# Patient Record
Sex: Male | Born: 2000 | Race: White | Hispanic: Yes | Marital: Single | Smoking: Never smoker
Health system: Southern US, Community
[De-identification: ages and names within clinical notes are randomized; demographics above are authoritative.]

---
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
## 2019-01-06 DIAGNOSIS — R05 Cough: Secondary | ICD-10-CM | POA: Diagnosis not present

## 2019-01-06 DIAGNOSIS — U071 COVID-19: Secondary | ICD-10-CM | POA: Diagnosis not present

## 2019-01-06 DIAGNOSIS — M791 Myalgia, unspecified site: Secondary | ICD-10-CM | POA: Diagnosis not present

## 2019-09-23 ENCOUNTER — Other Ambulatory Visit: Payer: Self-pay

## 2019-09-23 ENCOUNTER — Ambulatory Visit (INDEPENDENT_AMBULATORY_CARE_PROVIDER_SITE_OTHER): Payer: Medicaid Other | Admitting: Pediatrics

## 2019-09-23 ENCOUNTER — Other Ambulatory Visit (HOSPITAL_COMMUNITY)
Admission: RE | Admit: 2019-09-23 | Discharge: 2019-09-23 | Disposition: A | Discharge status: Home / Self Care | Payer: Medicaid Other | Source: Ambulatory Visit | Attending: Pediatrics | Admitting: Pediatrics

## 2019-09-23 ENCOUNTER — Encounter: Payer: Self-pay | Admitting: Pediatrics

## 2019-09-23 VITALS — BP 116/72 | HR 76 | Ht 73.03 in | Wt 156.6 lb

## 2019-09-23 DIAGNOSIS — K5900 Constipation, unspecified: Secondary | ICD-10-CM

## 2019-09-23 DIAGNOSIS — Z113 Encounter for screening for infections with a predominantly sexual mode of transmission: Secondary | ICD-10-CM

## 2019-09-23 DIAGNOSIS — K644 Residual hemorrhoidal skin tags: Secondary | ICD-10-CM

## 2019-09-23 DIAGNOSIS — Z0001 Encounter for general adult medical examination with abnormal findings: Secondary | ICD-10-CM | POA: Diagnosis not present

## 2019-09-23 LAB — POCT RAPID HIV: Rapid HIV, POC: NEGATIVE

## 2019-09-23 MED ORDER — POLYETHYLENE GLYCOL 3350 17 GM/SCOOP PO POWD: 17.0000 g | Freq: Every day | ORAL | 3 refills | Status: AC | PRN

## 2019-09-23 NOTE — Progress Notes (Signed)
Adolescent Well Care Visit Ronnie Rich is a 19 y.o. male who is here for well care.    PCP:  Carmie End, MD   History was provided by the patient and father.  Confidentiality was discussed with the patient   Current Issues: Current concerns include get has gotten a bump on his bottom that comes and goes.  It's tender to touch when it's there and it hurts when he has a BM.  He sometimes has large hard BMs that he strains to pass.  He has not tried any medication for constipation or hemorrhoids at home.  Nutrition: Nutrition/Eating Behaviors: balanced diet, not picky Adequate calcium in diet?: milk Supplements/ Vitamins: none  Exercise/ Media: Play any Sports?/ Exercise: likes basketball, plays with friends when the weather is nice  Media Rules or Monitoring?: N/A  Sleep:  Sleep: sometimes has trouble falling asleep  Social Screening: Lives with: dad, plans to move to [place] to live with his brother in a couple of weeks Parental relations:  good Activities, Work, and Research officer, political party?: has chores at home Concerns regarding behavior with peers?  no Stressors of note: COVID pandemic  Education: Graduated high school, plans to start classes at [employer] in the summer.  Confidential Social History: Tobacco?  no Secondhand smoke exposure?  no Drugs/ETOH?  no  Sexually Active?  yes  - 1 male partner (girlfriend) Pregnancy Prevention: condoms  Screenings: The patient completed the Rapid Assessment for Adolescent Preventive Services screening questionnaire and the following topics were identified as risk factors and discussed: exercise  In addition, the following topics were discussed as part of anticipatory guidance tobacco use, drug use, condom use, birth control and social isolation.  PHQ-9 completed and results indicated no signs of depression.    Physical Exam:  Vitals:   09/23/19 1328  BP: 116/72  Pulse: 76  Weight: 156 lb 9.6 oz (71 kg)  Height: 6'  1.03" (1.855 m)   BP 116/72   Pulse 76   Ht 6' 1.03" (1.855 m)   Wt 156 lb 9.6 oz (71 kg)   BMI 20.64 kg/m  Body mass index: body mass index is 20.64 kg/m. Blood pressure percentiles are not available for patients who are 18 years or older.   Hearing Screening   125Hz  250Hz  500Hz  1000Hz  2000Hz  3000Hz  4000Hz  6000Hz  8000Hz   Right ear:   20 20 20  20     Left ear:   20 20 20  20       Visual Acuity Screening   Right eye Left eye Both eyes  Without correction: 20/20 20/20 20/20   With correction:       General Appearance:   alert, oriented, no acute distress and well nourished  HENT: Normocephalic, no obvious abnormality, conjunctiva clear  Mouth:   Normal appearing teeth, no obvious discoloration, dental caries, or dental caps  Neck:   Supple; thyroid: no enlargement, symmetric, no tenderness/mass/nodules  Chest Normal male  Lungs:   Clear to auscultation bilaterally, normal work of breathing  Heart:   Regular rate and rhythm, S1 and S2 normal, no murmurs;   Abdomen:   Soft, non-tender, no mass, or organomegaly  GU normal male genitals, no testicular masses or hernia, Tanner stage IV, normal appearing anus with a <1cm external hemorrhoid on the   Musculoskeletal:   Tone and strength strong and symmetrical, all extremities               Lymphatic:   No cervical adenopathy  Skin/Hair/Nails:  Skin warm, dry and intact, no rashes, no bruises or petechiae  Neurologic:   Strength, gait, and coordination normal and age-appropriate     Assessment and Plan:   1. Encounter for general adult medical examination with abnormal findings  2. Screening for STDs (sexually transmitted diseases) - POCT Rapid HIV - negative - Urine cytology ancillary only (GC/Chlamydia)  3. External hemorrhoid Small residual external hemorrhoid noted on exam, much smaller than the hemorrhoid shows in photo on his phone.  Recommend using miralax prn to maintain soft BMs.  OK to use OTC hemorrhoid products if  needed for symptomatic relief.    BMI is appropriate for age  Hearing screening result:normal Vision screening result: normal   Return if symptoms worsen or fail to improve.Clifton Custard, MD

## 2019-09-24 LAB — URINE CYTOLOGY ANCILLARY ONLY
Chlamydia: NEGATIVE
Comment: NEGATIVE
Comment: NORMAL
Neisseria Gonorrhea: NEGATIVE

## 2021-04-12 ENCOUNTER — Telehealth: Payer: Self-pay

## 2021-04-12 DIAGNOSIS — Z09 Encounter for follow-up examination after completed treatment for conditions other than malignant neoplasm: Secondary | ICD-10-CM

## 2021-04-12 NOTE — Telephone Encounter (Signed)
SWCM mailed adolescent transition dismissal letter to pt, marked dismissed  from practice in pt's chart due to age. Can be seen by [facility] until age 20 if needed.   [name], BSW, QP Case Manager [facility] Office: [phone] Direct Number: [phone]
